# Patient Record
Sex: Female | Born: 1960
Health system: Southern US, Community
[De-identification: ages and names within clinical notes are randomized; demographics above are authoritative.]

## PROBLEM LIST (undated history)

## (undated) DIAGNOSIS — I10 Essential (primary) hypertension: Secondary | ICD-10-CM

## (undated) DIAGNOSIS — E876 Hypokalemia: Secondary | ICD-10-CM

## (undated) HISTORY — PX: EYE SURGERY: SHX253

## (undated) HISTORY — PX: TUBAL LIGATION: SHX77

## (undated) HISTORY — PX: FOOT SURGERY: SHX648

## (undated) HISTORY — PX: LEG SURGERY: SHX1003

## (undated) HISTORY — PX: HERNIA REPAIR: SHX51

---

## 2011-01-30 ENCOUNTER — Encounter: Payer: Self-pay | Admitting: *Deleted

## 2011-01-30 ENCOUNTER — Emergency Department (HOSPITAL_BASED_OUTPATIENT_CLINIC_OR_DEPARTMENT_OTHER)
Admission: EM | Admit: 2011-01-30 | Discharge: 2011-01-30 | Disposition: A | Discharge status: Home / Self Care | Payer: BC Managed Care – PPO | Attending: Emergency Medicine | Admitting: Emergency Medicine

## 2011-01-30 ENCOUNTER — Emergency Department (INDEPENDENT_AMBULATORY_CARE_PROVIDER_SITE_OTHER): Payer: BC Managed Care – PPO

## 2011-01-30 DIAGNOSIS — R109 Unspecified abdominal pain: Secondary | ICD-10-CM

## 2011-01-30 DIAGNOSIS — K449 Diaphragmatic hernia without obstruction or gangrene: Secondary | ICD-10-CM

## 2011-01-30 DIAGNOSIS — R110 Nausea: Secondary | ICD-10-CM

## 2011-01-30 DIAGNOSIS — I10 Essential (primary) hypertension: Secondary | ICD-10-CM

## 2011-01-30 DIAGNOSIS — K5900 Constipation, unspecified: Secondary | ICD-10-CM | POA: Insufficient documentation

## 2011-01-30 HISTORY — DX: Essential (primary) hypertension: I10

## 2011-01-30 LAB — COMPREHENSIVE METABOLIC PANEL WITH GFR
ALT: 8 U/L (ref 0–35)
AST: 15 U/L (ref 0–37)
Albumin: 3.8 g/dL (ref 3.5–5.2)
Alkaline Phosphatase: 76 U/L (ref 39–117)
BUN: 9 mg/dL (ref 6–23)
CO2: 27 meq/L (ref 19–32)
Calcium: 8.9 mg/dL (ref 8.4–10.5)
Chloride: 103 meq/L (ref 96–112)
Creatinine, Ser: 0.8 mg/dL (ref 0.50–1.10)
GFR calc Af Amer: >90 mL/min
GFR calc non Af Amer: 83 mL/min — ABNORMAL LOW
Glucose, Bld: 89 mg/dL (ref 70–99)
Potassium: 3.6 meq/L (ref 3.5–5.1)
Sodium: 138 meq/L (ref 135–145)
Total Bilirubin: 0.3 mg/dL (ref 0.3–1.2)
Total Protein: 7.2 g/dL (ref 6.0–8.3)

## 2011-01-30 LAB — URINALYSIS, ROUTINE W REFLEX MICROSCOPIC
Glucose, UA: NEGATIVE mg/dL
Hgb urine dipstick: NEGATIVE
Leukocytes, UA: NEGATIVE
Protein, ur: NEGATIVE mg/dL
Specific Gravity, Urine: 1.011 (ref 1.005–1.030)
pH: 5.5 (ref 5.0–8.0)

## 2011-01-30 LAB — DIFFERENTIAL
Basophils Absolute: 0 10*3/uL (ref 0.0–0.1)
Lymphocytes Relative: 11 % — ABNORMAL LOW (ref 12–46)
Lymphs Abs: 0.5 10*3/uL — ABNORMAL LOW (ref 0.7–4.0)
Monocytes Absolute: 0.2 10*3/uL (ref 0.1–1.0)
Neutro Abs: 3.8 10*3/uL (ref 1.7–7.7)

## 2011-01-30 LAB — CBC
HCT: 37.1 % (ref 36.0–46.0)
Hemoglobin: 12 g/dL (ref 12.0–15.0)
RBC: 4.69 MIL/uL (ref 3.87–5.11)
RDW: 14 % (ref 11.5–15.5)
WBC: 4.4 10*3/uL (ref 4.0–10.5)

## 2011-01-30 MED ORDER — SODIUM CHLORIDE 0.9 % IV SOLN
Freq: Once | INTRAVENOUS | Status: AC
  Administered 2011-01-30: 12:00:00 via INTRAVENOUS

## 2011-01-30 MED ORDER — MORPHINE SULFATE 4 MG/ML IJ SOLN
4.0000 mg | Freq: Once | INTRAMUSCULAR | Status: AC
  Administered 2011-01-30: 4 mg via INTRAVENOUS
  Filled 2011-01-30: qty 1

## 2011-01-30 MED ORDER — IOHEXOL 300 MG/ML  SOLN: 100.0000 mL | Freq: Once | INTRAMUSCULAR | Status: DC | PRN

## 2011-01-30 NOTE — ED Provider Notes (Signed)
History     CSN: [id number] Arrival date & time: 01/30/2011 10:33 AM   First MD Initiated Contact with Patient 01/30/11 1119      Chief Complaint  Patient presents with  . Abdominal Pain    (Consider location/radiation/quality/duration/timing/severity/associated sxs/prior treatment) Patient is a 50 y.o. female presenting with abdominal pain. The history is provided by the patient.  Abdominal Pain The primary symptoms of the illness include abdominal pain, fever, fatigue and nausea. The primary symptoms of the illness do not include vomiting or diarrhea. The current episode started yesterday. The onset of the illness was gradual. The problem has been gradually worsening.  The patient states that she believes she is currently not pregnant. The patient has not had a change in bowel habit. Symptoms associated with the illness do not include chills, constipation, urgency, hematuria or frequency.    Past Medical History  Diagnosis Date  . Hypertension     Past Surgical History  Procedure Date  . Cesarean section   . Foot surgery   . Leg surgery   . Eye surgery   . Tubal ligation     No family history on file.  History  Substance Use Topics  . Smoking status: Never Smoker   . Smokeless tobacco: Not on file  . Alcohol Use: No    OB History    Grav Para Term Preterm Abortions TAB SAB Ect Mult Living                  Review of Systems  Constitutional: Positive for fever and fatigue. Negative for chills.  Gastrointestinal: Positive for nausea and abdominal pain. Negative for vomiting, diarrhea and constipation.  Genitourinary: Negative for urgency, frequency and hematuria.  All other systems reviewed and are negative.    Allergies  Review of patient's allergies indicates no known allergies.  Home Medications   Current Outpatient Rx  Name Route Sig Dispense Refill  . AMLODIPINE BESYLATE 10 MG PO TABS Oral Take 10 mg by mouth daily.      . ASPIRIN 325 MG PO TABS  Oral Take 325 mg by mouth daily.      Marland Kitchen POTASSIUM CHLORIDE CRYS CR 20 MEQ PO TBCR Oral Take 20 mEq by mouth 2 (two) times daily.      . TERBINAFINE HCL 250 MG PO TABS Oral Take 250 mg by mouth daily.        BP 153/75  Pulse 75  Temp(Src) 99.9 F (37.7 C) (Oral)  Resp 18  SpO2 100%  Physical Exam  Nursing note and vitals reviewed. Constitutional: She is oriented to person, place, and time. She appears well-developed and well-nourished. No distress.  HENT:  Head: Normocephalic and atraumatic.  Neck: Normal range of motion. Neck supple.  Cardiovascular: Normal rate and regular rhythm.  Exam reveals no gallop and no friction rub.   No murmur heard. Pulmonary/Chest: Effort normal and breath sounds normal. No respiratory distress. She has no wheezes.  Abdominal: Soft. Bowel sounds are normal. She exhibits no distension (mild ttp in the periumbilical area.  No rebound or guarding). There is tenderness.  Musculoskeletal: Normal range of motion.  Neurological: She is alert and oriented to person, place, and time.  Skin: Skin is warm and dry. She is not diaphoretic.    ED Course  Procedures (including critical care time)   Labs Reviewed  URINALYSIS, ROUTINE W REFLEX MICROSCOPIC  CBC  DIFFERENTIAL  COMPREHENSIVE METABOLIC PANEL  LIPASE, BLOOD   No results found.  No diagnosis found.    MDM  Labs, CT okay with the exception of suspected constipation.          Geoffery Lyons, MD 01/30/11 1346

## 2011-01-30 NOTE — ED Notes (Signed)
Patient c/o abd pain for the past two days, nausea, no v/d, took two tylenol last night for HA, able to sip Coke, states she doesn't feel like eating anything due to abd pain

## 2011-02-22 ENCOUNTER — Emergency Department (INDEPENDENT_AMBULATORY_CARE_PROVIDER_SITE_OTHER): Payer: BC Managed Care – PPO

## 2011-02-22 ENCOUNTER — Encounter (HOSPITAL_BASED_OUTPATIENT_CLINIC_OR_DEPARTMENT_OTHER): Payer: Self-pay | Admitting: *Deleted

## 2011-02-22 ENCOUNTER — Emergency Department (HOSPITAL_BASED_OUTPATIENT_CLINIC_OR_DEPARTMENT_OTHER)
Admission: EM | Admit: 2011-02-22 | Discharge: 2011-02-22 | Disposition: A | Discharge status: Home / Self Care | Payer: BC Managed Care – PPO | Attending: Emergency Medicine | Admitting: Emergency Medicine

## 2011-02-22 DIAGNOSIS — I10 Essential (primary) hypertension: Secondary | ICD-10-CM | POA: Insufficient documentation

## 2011-02-22 DIAGNOSIS — R05 Cough: Secondary | ICD-10-CM | POA: Insufficient documentation

## 2011-02-22 DIAGNOSIS — R509 Fever, unspecified: Secondary | ICD-10-CM

## 2011-02-22 DIAGNOSIS — J111 Influenza due to unidentified influenza virus with other respiratory manifestations: Secondary | ICD-10-CM | POA: Insufficient documentation

## 2011-02-22 DIAGNOSIS — R059 Cough, unspecified: Secondary | ICD-10-CM | POA: Insufficient documentation

## 2011-02-22 DIAGNOSIS — R0989 Other specified symptoms and signs involving the circulatory and respiratory systems: Secondary | ICD-10-CM

## 2011-02-22 DIAGNOSIS — R52 Pain, unspecified: Secondary | ICD-10-CM

## 2011-02-22 MED ORDER — ACETAMINOPHEN 325 MG PO TABS
650.0000 mg | ORAL_TABLET | Freq: Once | ORAL | Status: AC
  Administered 2011-02-22: 650 mg via ORAL
  Filled 2011-02-22: qty 2

## 2011-02-22 MED ORDER — OSELTAMIVIR PHOSPHATE 75 MG PO CAPS: 75.0000 mg | ORAL_CAPSULE | Freq: Two times a day (BID) | ORAL | Status: AC

## 2011-02-22 NOTE — ED Provider Notes (Signed)
History     CSN: [id number] Arrival date & time: 02/22/2011  7:36 PM   First MD Initiated Contact with Patient 02/22/11 1959      Chief Complaint  Patient presents with  . Cough  . Chills    (Consider location/radiation/quality/duration/timing/severity/associated sxs/prior treatment) Patient is a 50 y.o. female presenting with cough. The history is provided by the patient.  Cough This is a new problem. The current episode started yesterday. The problem occurs constantly. The problem has been rapidly worsening. The cough is non-productive. The maximum temperature recorded prior to her arrival was 101 to 101.9 F. The fever has been present for less than 1 day. Associated symptoms include chills, sweats, headaches and myalgias. Pertinent negatives include no chest pain, no ear congestion and no shortness of breath. She has tried nothing for the symptoms. She is not a smoker.    Past Medical History  Diagnosis Date  . Hypertension     Past Surgical History  Procedure Date  . Cesarean section   . Foot surgery   . Leg surgery   . Eye surgery   . Tubal ligation     No family history on file.  History  Substance Use Topics  . Smoking status: Never Smoker   . Smokeless tobacco: Not on file  . Alcohol Use: No    OB History    Grav Para Term Preterm Abortions TAB SAB Ect Mult Living                  Review of Systems  Constitutional: Positive for chills.  Respiratory: Positive for cough. Negative for shortness of breath.   Cardiovascular: Negative for chest pain.  Musculoskeletal: Positive for myalgias.  Neurological: Positive for headaches.  All other systems reviewed and are negative.    Allergies  Review of patient's allergies indicates no known allergies.  Home Medications   Current Outpatient Rx  Name Route Sig Dispense Refill  . ACETAMINOPHEN 500 MG PO TABS Oral Take 500 mg by mouth every 6 (six) hours as needed. For fever and pain     . AMLODIPINE  BESYLATE 10 MG PO TABS Oral Take 10 mg by mouth daily.      . ASPIRIN 325 MG PO TABS Oral Take 325 mg by mouth daily.      Marland Kitchen POTASSIUM CHLORIDE CRYS CR 20 MEQ PO TBCR Oral Take 20 mEq by mouth 2 (two) times daily.        BP 154/82  Pulse 115  Temp(Src) 101 F (38.3 C) (Oral)  Resp 22  Ht 5\' 3"  (1.6 m)  Wt 165 lb (74.844 kg)  BMI 29.23 kg/m2  SpO2 100%  Physical Exam  Nursing note and vitals reviewed. Constitutional: She is oriented to person, place, and time. She appears well-developed and well-nourished. No distress.  HENT:  Head: Normocephalic and atraumatic.  Neck: Normal range of motion. Neck supple.  Cardiovascular: Normal rate and regular rhythm.  Exam reveals no gallop and no friction rub.   No murmur heard. Pulmonary/Chest: Effort normal and breath sounds normal. No respiratory distress. She has no wheezes.  Abdominal: Soft. Bowel sounds are normal. She exhibits no distension. There is no tenderness.  Musculoskeletal: Normal range of motion.  Neurological: She is alert and oriented to person, place, and time.  Skin: Skin is warm and dry. She is not diaphoretic.    ED Course  Procedures (including critical care time)  Labs Reviewed - No data to display No results found.  No diagnosis found.    MDM  Symptoms sound like influenza.  Patient would like tamiflu.  Will prescribe this.  To return prn.        Geoffery Lyons, MD 02/22/11 2005

## 2011-02-22 NOTE — ED Notes (Signed)
C/o fever, chills, and cough x 2 days

## 2015-11-12 ENCOUNTER — Emergency Department (HOSPITAL_BASED_OUTPATIENT_CLINIC_OR_DEPARTMENT_OTHER)
Admission: EM | Admit: 2015-11-12 | Discharge: 2015-11-12 | Disposition: A | Discharge status: Home / Self Care | Payer: Commercial Managed Care - HMO | Attending: Emergency Medicine | Admitting: Emergency Medicine

## 2015-11-12 ENCOUNTER — Encounter (HOSPITAL_BASED_OUTPATIENT_CLINIC_OR_DEPARTMENT_OTHER): Payer: Self-pay | Admitting: *Deleted

## 2015-11-12 DIAGNOSIS — Z7982 Long term (current) use of aspirin: Secondary | ICD-10-CM | POA: Insufficient documentation

## 2015-11-12 DIAGNOSIS — Z79899 Other long term (current) drug therapy: Secondary | ICD-10-CM | POA: Diagnosis not present

## 2015-11-12 DIAGNOSIS — K529 Noninfective gastroenteritis and colitis, unspecified: Secondary | ICD-10-CM | POA: Insufficient documentation

## 2015-11-12 DIAGNOSIS — R112 Nausea with vomiting, unspecified: Secondary | ICD-10-CM | POA: Diagnosis present

## 2015-11-12 DIAGNOSIS — I10 Essential (primary) hypertension: Secondary | ICD-10-CM | POA: Insufficient documentation

## 2015-11-12 LAB — CBC WITH DIFFERENTIAL/PLATELET
Basophils Absolute: 0 10*3/uL (ref 0.0–0.1)
Basophils Relative: 0 %
EOS PCT: 1 %
Eosinophils Absolute: 0 10*3/uL (ref 0.0–0.7)
HCT: 37.9 % (ref 36.0–46.0)
Hemoglobin: 12.2 g/dL (ref 12.0–15.0)
LYMPHS ABS: 1.1 10*3/uL (ref 0.7–4.0)
LYMPHS PCT: 20 %
MCH: 26.3 pg (ref 26.0–34.0)
MCHC: 32.2 g/dL (ref 30.0–36.0)
MCV: 81.7 fL (ref 78.0–100.0)
MONO ABS: 0.3 10*3/uL (ref 0.1–1.0)
Monocytes Relative: 6 %
Neutro Abs: 4 10*3/uL (ref 1.7–7.7)
Neutrophils Relative %: 73 %
PLATELETS: 193 10*3/uL (ref 150–400)
RBC: 4.64 MIL/uL (ref 3.87–5.11)
RDW: 13.8 % (ref 11.5–15.5)
WBC: 5.5 10*3/uL (ref 4.0–10.5)

## 2015-11-12 LAB — COMPREHENSIVE METABOLIC PANEL
ALK PHOS: 53 U/L (ref 38–126)
ALT: 15 U/L (ref 14–54)
AST: 18 U/L (ref 15–41)
Albumin: 4.1 g/dL (ref 3.5–5.0)
Anion gap: 7 (ref 5–15)
BUN: 13 mg/dL (ref 6–20)
CALCIUM: 8.8 mg/dL — AB (ref 8.9–10.3)
CHLORIDE: 102 mmol/L (ref 101–111)
CO2: 26 mmol/L (ref 22–32)
CREATININE: 0.76 mg/dL (ref 0.44–1.00)
GFR calc Af Amer: >60 mL/min (ref >60)
Glucose, Bld: 99 mg/dL (ref 65–99)
Potassium: 3.6 mmol/L (ref 3.5–5.1)
SODIUM: 135 mmol/L (ref 135–145)
Total Bilirubin: 0.4 mg/dL (ref 0.3–1.2)
Total Protein: 7.1 g/dL (ref 6.5–8.1)

## 2015-11-12 LAB — LIPASE, BLOOD: Lipase: 37 U/L (ref 11–51)

## 2015-11-12 MED ORDER — ONDANSETRON 8 MG PO TBDP: ORAL_TABLET | ORAL | 0 refills | Status: DC

## 2015-11-12 MED ORDER — SODIUM CHLORIDE 0.9 % IV BOLUS (SEPSIS)
1000.0000 mL | Freq: Once | INTRAVENOUS | Status: AC
  Administered 2015-11-12: 1000 mL via INTRAVENOUS

## 2015-11-12 MED ORDER — KETOROLAC TROMETHAMINE 30 MG/ML IJ SOLN
30.0000 mg | Freq: Once | INTRAMUSCULAR | Status: AC
  Administered 2015-11-12: 30 mg via INTRAVENOUS
  Filled 2015-11-12: qty 1

## 2015-11-12 MED ORDER — ONDANSETRON HCL 4 MG/2ML IJ SOLN
4.0000 mg | Freq: Once | INTRAMUSCULAR | Status: AC
  Administered 2015-11-12: 4 mg via INTRAVENOUS
  Filled 2015-11-12: qty 2

## 2015-11-12 MED FILL — ONDANSETRON ODT 8 MG TABLET: 8 | 1 days supply | Qty: 6 | Fill #0

## 2015-11-12 NOTE — ED Provider Notes (Signed)
[facility]   CSN: [id number] Arrival date & time: 11/12/15  1401     History   Chief Complaint Chief Complaint  Patient presents with  . Emesis    HPI Blandina Renaldo is a 55 y.o. female.  Patient is a 55 year old female with history of hypertension. She presents for evaluation of nausea and vomiting. She reports eating a salad at lunch, then shortly after she became very nauseated and vomited multiple times. She reports coughing up a small amount of blood afterward. She denies any abdominal pain. She denies any diarrhea. She denies any ill contacts.   The history is provided by the patient.  Emesis   This is a new problem. The current episode started 1 to 2 hours ago. The problem has been resolved. There has been no fever. Pertinent negatives include no abdominal pain, no chills, no diarrhea and no fever.    Past Medical History:  Diagnosis Date  . Hypertension     There are no active problems to display for this patient.   Past Surgical History:  Procedure Laterality Date  . CESAREAN SECTION    . EYE SURGERY    . FOOT SURGERY    . HERNIA REPAIR    . LEG SURGERY    . TUBAL LIGATION      OB History    No data available       Home Medications    Prior to Admission medications   Medication Sig Start Date End Date Taking? Authorizing Provider  amLODipine (NORVASC) 10 MG tablet Take 10 mg by mouth daily.     Yes Historical Provider, MD  aspirin 325 MG tablet Take 325 mg by mouth daily.     Yes Historical Provider, MD  losartan (COZAAR) 25 MG tablet Take 25 mg by mouth daily.   Yes Historical Provider, MD  acetaminophen (TYLENOL) 500 MG tablet Take 500 mg by mouth every 6 (six) hours as needed. For fever and pain     Historical Provider, MD  potassium chloride SA (K-DUR,KLOR-CON) 20 MEQ tablet Take 20 mEq by mouth 2 (two) times daily.      Historical Provider, MD    Family History No family history on file.  Social History Social  History  Substance Use Topics  . Smoking status: Never Smoker  . Smokeless tobacco: Never Used  . Alcohol use No     Allergies   Review of patient's allergies indicates no known allergies.   Review of Systems Review of Systems  Constitutional: Negative for chills and fever.  Gastrointestinal: Positive for vomiting. Negative for abdominal pain and diarrhea.  All other systems reviewed and are negative.    Physical Exam Updated Vital Signs BP 149/78   Pulse 69   Temp 98.2 F (36.8 C) (Oral)   Resp 20   Ht 5\' 2"  (1.575 m)   Wt 147 lb (66.7 kg)   SpO2 100%   BMI 26.89 kg/m   Physical Exam  Constitutional: She is oriented to person, place, and time. She appears well-developed and well-nourished. No distress.  HENT:  Head: Normocephalic and atraumatic.  Neck: Normal range of motion. Neck supple.  Cardiovascular: Normal rate and regular rhythm.  Exam reveals no gallop and no friction rub.   No murmur heard. Pulmonary/Chest: Effort normal and breath sounds normal. No respiratory distress. She has no wheezes.  Abdominal: Soft. Bowel sounds are normal. She exhibits no distension. There is no tenderness.  Musculoskeletal: Normal range of motion.  Neurological: She is alert and oriented to person, place, and time.  Skin: Skin is warm and dry. She is not diaphoretic.  Nursing note and vitals reviewed.    ED Treatments / Results  Labs (all labs ordered are listed, but only abnormal results are displayed) Labs Reviewed  COMPREHENSIVE METABOLIC PANEL  LIPASE, BLOOD  CBC WITH DIFFERENTIAL/PLATELET    EKG  EKG Interpretation None       Radiology No results found.  Procedures Procedures (including critical care time)  Medications Ordered in ED Medications  sodium chloride 0.9 % bolus 1,000 mL (not administered)  ketorolac (TORADOL) 30 MG/ML injection 30 mg (not administered)  ondansetron (ZOFRAN) injection 4 mg (not administered)     Initial Impression /  Assessment and Plan / ED Course  I have reviewed the triage vital signs and the nursing notes.  Pertinent labs & imaging results that were available during my care of the patient were reviewed by me and considered in my medical decision making (see chart for details).  Clinical Course    Patient presents with complaints of multiple episodes of vomiting that occurred shortly after eating a salad at work. Her abdominal exam is benign and she is complaining of no pain. She is afebrile and vitals are stable. Laboratory studies are reassuring. She is feeling better after IV fluids, Toradol, and Zofran and I believe appropriate for discharge. I suspect some sort of food borne illness or possibly a viral etiology. She will be discharged with Zofran and when necessary return.  Final Clinical Impressions(s) / ED Diagnoses   Final diagnoses:  None    New Prescriptions New Prescriptions   No medications on file     Geoffery Lyonsouglas Nason Conradt, MD 11/12/15 1530

## 2015-11-12 NOTE — ED Triage Notes (Signed)
States after eating a salad she vomited. She left work due to nausea. She vomited 5 more times with force and saw some blood in her vomitus.

## 2015-11-12 NOTE — Discharge Instructions (Signed)
Zofran as prescribed as needed for nausea.  Return to the emergency department if you develop severe abdominal pain, worsening vomiting, or other new and concerning symptoms.

## 2017-01-31 ENCOUNTER — Encounter: Payer: Self-pay | Admitting: Emergency Medicine

## 2017-01-31 ENCOUNTER — Emergency Department (HOSPITAL_BASED_OUTPATIENT_CLINIC_OR_DEPARTMENT_OTHER): Payer: 59

## 2017-01-31 ENCOUNTER — Emergency Department (HOSPITAL_BASED_OUTPATIENT_CLINIC_OR_DEPARTMENT_OTHER)
Admission: EM | Admit: 2017-01-31 | Discharge: 2017-01-31 | Disposition: A | Discharge status: Home / Self Care | Payer: 59 | Attending: Physician Assistant | Admitting: Physician Assistant

## 2017-01-31 DIAGNOSIS — Z79899 Other long term (current) drug therapy: Secondary | ICD-10-CM | POA: Insufficient documentation

## 2017-01-31 DIAGNOSIS — I10 Essential (primary) hypertension: Secondary | ICD-10-CM | POA: Diagnosis not present

## 2017-01-31 DIAGNOSIS — M25561 Pain in right knee: Secondary | ICD-10-CM | POA: Diagnosis present

## 2017-01-31 MED ORDER — IBUPROFEN 800 MG PO TABS: 800.0000 mg | ORAL_TABLET | Freq: Three times a day (TID) | ORAL | 0 refills | Status: DC

## 2017-01-31 MED FILL — IBUPROFEN 800 MG TAB: 800 | 7 days supply | Qty: 21 | Fill #0

## 2017-01-31 NOTE — ED Provider Notes (Signed)
[facility] EMERGENCY DEPARTMENT Provider Note   CSN: [id number] Arrival date & time: 01/31/17  [id number]     History   Chief Complaint Chief Complaint  Patient presents with  . Knee Pain    right    HPI Rebecca Thompson is a 56 y.o. female.  HPI   Patient is a 56 year old female presenting with right knee pain.  Patient has noticed over the last week she has been having increasing knee pain.  She put up a pack of ice on it yesterday and she let it get too cold.  She felt like she had a mild abrasion to the top of the skin because of cold ice.  Patient reports now increased swelling to the anterior part of the knee.  Patient has mild pain with flexion but mostly pain with use.  No fevers or other signs of systemic illness.  No trauma.  Past Medical History:  Diagnosis Date  . Hypertension     There are no active problems to display for this patient.   Past Surgical History:  Procedure Laterality Date  . CESAREAN SECTION    . EYE SURGERY    . FOOT SURGERY    . HERNIA REPAIR    . LEG SURGERY    . TUBAL LIGATION      OB History    No data available       Home Medications    Prior to Admission medications   Medication Sig Start Date End Date Taking? Authorizing Provider  amLODipine (NORVASC) 10 MG tablet Take 10 mg by mouth daily.     Yes [provider]  aspirin 325 MG tablet Take 325 mg by mouth daily.     Yes [provider]  losartan (COZAAR) 25 MG tablet Take 25 mg by mouth daily.   Yes [provider]  potassium chloride SA (K-DUR,KLOR-CON) 20 MEQ tablet Take 20 mEq by mouth 2 (two) times daily.     Yes [provider]  acetaminophen (TYLENOL) 500 MG tablet Take 500 mg by mouth every 6 (six) hours as needed. For fever and pain     [provider]  ibuprofen (ADVIL,MOTRIN) 800 MG tablet Take 1 tablet (800 mg total) by mouth 3 (three) times daily. 01/31/17   Kenidee Cregan, Cindee Saltourteney Lyn, MD  ondansetron (ZOFRAN ODT) 8  MG disintegrating tablet 8mg  ODT q4 hours prn nausea 11/12/15   Geoffery Lyonselo, Douglas, MD    Family History No family history on file.  Social History Social History   Tobacco Use  . Smoking status: Never Smoker  . Smokeless tobacco: Never Used  Substance Use Topics  . Alcohol use: No  . Drug use: No     Allergies   Patient has no known allergies.   Review of Systems Review of Systems  Constitutional: Negative for activity change.  Respiratory: Negative for shortness of breath.   Cardiovascular: Negative for chest pain.  Gastrointestinal: Negative for abdominal pain.  Musculoskeletal: Positive for arthralgias.     Physical Exam Updated Vital Signs BP (!) 142/91 (BP Location: Left Arm)   Pulse 85   Temp 98.3 F (36.8 C) (Oral)   Resp 20   Ht 5\' 3"  (1.6 m)   Wt 68 kg (150 lb)   SpO2 100%   BMI 26.57 kg/m   Physical Exam  Constitutional: She is oriented to person, place, and time. She appears well-developed and well-nourished.  HENT:  Head: Normocephalic and atraumatic.  Eyes: Right eye exhibits no  discharge.  Cardiovascular: Normal rate, regular rhythm and normal heart sounds.  No murmur heard. Pulmonary/Chest: Effort normal.  Musculoskeletal:  R knee with swelling, no erythema, no pain with passive or active motion.   Neurological: She is oriented to person, place, and time.  Skin: Skin is warm and dry. She is not diaphoretic.  Psychiatric: She has a normal mood and affect.  Nursing note and vitals reviewed.    ED Treatments / Results  Labs (all labs ordered are listed, but only abnormal results are displayed) Labs Reviewed - No data to display  EKG  EKG Interpretation None       Radiology Dg Knee Complete 4 Views Right  Result Date: 01/31/2017 CLINICAL DATA:  Right knee pain for 6 days EXAM: RIGHT KNEE - COMPLETE 4+ VIEW COMPARISON:  None. FINDINGS: No evidence of fracture, dislocation, or joint effusion. No evidence of arthropathy or other focal  bone abnormality. Soft tissues are unremarkable. IMPRESSION: Negative. Electronically Signed   By: Charlett NoseKevin  Dover M.D.   On: 01/31/2017 09:13    Procedures Procedures (including critical care time)  Medications Ordered in ED Medications - No data to display   Initial Impression / Assessment and Plan / ED Course  I have reviewed the triage vital signs and the nursing notes.  Pertinent labs & imaging results that were available during my care of the patient were reviewed by me and considered in my medical decision making (see chart for details).    Patient is a 56 year old female presenting with right knee pain.  Patient has noticed over the last week she has been having increasing knee pain.  She put up a pack of ice on it yesterday and she let it get too cold.  She felt like she had a mild abrasion to the top of the skin because of cold ice.  Patient reports now increased swelling to the anterior part of the knee.  Patient has mild pain with flexion but mostly pain with use.  No fevers or other signs of systemic illness.  No trauma.  9:02 AM  Suspicion for septic arthritis.  I do suspect this could be a bursitis.   Xray normal. Will have her do symptomatic first line therapy of ibiuprofen and rest ice elevate, and follow up with [name] as outpatient if not improving.    Final Clinical Impressions(s) / ED Diagnoses   Final diagnoses:  Acute pain of right knee    ED Discharge Orders        Ordered    ibuprofen (ADVIL,MOTRIN) 800 MG tablet  3 times daily     01/31/17 0959       Abelino DerrickMackuen, Deisi Salonga Lyn, MD 01/31/17 1000

## 2017-01-31 NOTE — ED Notes (Signed)
Pt back from XR in WC.

## 2017-01-31 NOTE — ED Triage Notes (Addendum)
Pt reports right knee pain since last Wednesday; reports hx of arthritis in knee. Pt denies injury. Pt has been putting an ice pack on right knee without something over it; knee is pink and painful to the touch. Pain rated 9/10.

## 2017-01-31 NOTE — Discharge Instructions (Signed)
We think likely a bursitis.  We want you to use ibuprofen around-the-clock to help with inflammation.  In addition use a knee sleeve to help support.  You can use ice elevation to help with symptoms.  You should follow-up with Dr. Pearletha ForgeHudnall listed above if you are not improving.  Be sure to take the ibuprofen with food as we discussed.

## 2018-09-18 ENCOUNTER — Emergency Department (HOSPITAL_BASED_OUTPATIENT_CLINIC_OR_DEPARTMENT_OTHER)
Admission: EM | Admit: 2018-09-18 | Discharge: 2018-09-18 | Disposition: A | Discharge status: Home / Self Care | Payer: 59 | Attending: Emergency Medicine | Admitting: Emergency Medicine

## 2018-09-18 ENCOUNTER — Other Ambulatory Visit: Payer: Self-pay

## 2018-09-18 ENCOUNTER — Encounter (HOSPITAL_BASED_OUTPATIENT_CLINIC_OR_DEPARTMENT_OTHER): Payer: Self-pay | Admitting: Emergency Medicine

## 2018-09-18 DIAGNOSIS — M5432 Sciatica, left side: Secondary | ICD-10-CM | POA: Insufficient documentation

## 2018-09-18 DIAGNOSIS — I10 Essential (primary) hypertension: Secondary | ICD-10-CM | POA: Insufficient documentation

## 2018-09-18 HISTORY — DX: Hypokalemia: E87.6

## 2018-09-18 MED ORDER — PREDNISONE 20 MG PO TABS: 40.0000 mg | ORAL_TABLET | Freq: Every day | ORAL | 0 refills | Status: AC

## 2018-09-18 MED FILL — predniSONE 20 MG TABS: 20 | 5 days supply | Qty: 10 | Fill #0

## 2018-09-18 NOTE — ED Provider Notes (Signed)
[facility] Emergency Department Provider Note MRN:  [id number]  Arrival date & time: 09/18/18     Chief Complaint   Leg Pain   History of Present Illness   Rebecca Thompson is a 58 y.o. year-old female with a history of hypertension presenting to the ED with chief complaint of leg pain.  Patient began having back pain 2 weeks ago, which radiated down the back of the left leg.  The back pain resolved, continues to have some pain to the left buttocks rating down to the left leg.  5 days ago noticed that the lateral side of her left foot has decreased sensation.  She denies fever, no IV drug use, no chest pain or shortness of breath, no abdominal pain, no bowel or bladder dysfunction, no numbness to the lab area, no weakness to the legs, no numbness or weakness to the arms.  Review of Systems  A complete 10 system review of systems was obtained and all systems are negative except as noted in the HPI and PMH.   Patient's Health History    Past Medical History:  Diagnosis Date  . Hypertension   . Hypokalemia     Past Surgical History:  Procedure Laterality Date  . CESAREAN SECTION    . EYE SURGERY    . FOOT SURGERY    . HERNIA REPAIR    . LEG SURGERY    . TUBAL LIGATION      No family history on file.  Social History   Socioeconomic History  . Marital status: Single    Spouse name: Not on file  . Number of children: Not on file  . Years of education: Not on file  . Highest education level: Not on file  Occupational History  . Not on file  Social Needs  . Financial resource strain: Not on file  . Food insecurity    Worry: Not on file    Inability: Not on file  . Transportation needs    Medical: Not on file    Non-medical: Not on file  Tobacco Use  . Smoking status: Never Smoker  . Smokeless tobacco: Never Used  Substance and Sexual Activity  . Alcohol use: No  . Drug use: No  . Sexual activity: Yes    Birth control/protection: Surgical   Lifestyle  . Physical activity    Days per week: Not on file    Minutes per session: Not on file  . Stress: Not on file  Relationships  . Social Musicianconnections    Talks on phone: Not on file    Gets together: Not on file    Attends religious service: Not on file    Active member of club or organization: Not on file    Attends meetings of clubs or organizations: Not on file    Relationship status: Not on file  . Intimate partner violence    Fear of current or ex partner: Not on file    Emotionally abused: Not on file    Physically abused: Not on file    Forced sexual activity: Not on file  Other Topics Concern  . Not on file  Social History Narrative  . Not on file     Physical Exam  Vital Signs and Nursing Notes reviewed Vitals:   09/18/18 0756  BP: (!) 155/73  Pulse: 70  Resp: 16  Temp: 98.3 F (36.8 C)  SpO2: 100%    CONSTITUTIONAL: Well-appearing, NAD NEURO:  Alert and oriented  x 3, no focal deficits EYES:  eyes equal and reactive ENT/NECK:  no LAD, no JVD CARDIO: Regular rate, well-perfused, normal S1 and S2 PULM:  CTAB no wheezing or rhonchi GI/GU:  normal bowel sounds, non-distended, non-tender MSK/SPINE:  No gross deformities, no edema; positive left straight leg test, no lumbar spinal tenderness, 2+ patellar reflexes bilaterally, subjective decreased sensation to the lateral aspect of the left foot in the S1 distribution SKIN:  no rash, atraumatic PSYCH:  Appropriate speech and behavior  Diagnostic and Interventional Summary    Labs Reviewed - No data to display  No orders to display    Medications - No data to display   Procedures Critical Care  ED Course and Medical Decision Making  I have reviewed the triage vital signs and the nursing notes.  Pertinent labs & imaging results that were available during my care of the patient were reviewed by me and considered in my medical decision making (see below for details).  Favoring sciatica with distal  decreased sensation in the foot.  Patient has no bowel or bladder dysfunction, no progressive neurological deficit as her symptoms have been static for the past 5 days.  No indication for emergent MRI or further testing today, no significant trauma.  Patient would likely benefit from a steroid burst and if not improving follow-up with a neurosurgeon for possible MRI imaging as an outpatient.  After the discussed management above, the patient was determined to be safe for discharge.  The patient was in agreement with this plan and all questions regarding their care were answered.  ED return precautions were discussed and the patient will return to the ED with any significant worsening of condition.  Barth Kirks. Sedonia Small, MD [place] [email or web address]  Final Clinical Impressions(s) / ED Diagnoses     ICD-10-CM   1. Sciatica of left side  M54.32     ED Discharge Orders         Ordered    predniSONE (DELTASONE) 20 MG tablet  Daily     09/18/18 0824             Maudie Flakes, MD 09/18/18 [phone]

## 2018-09-18 NOTE — ED Notes (Signed)
ED Provider at bedside. 

## 2018-09-18 NOTE — Discharge Instructions (Addendum)
You were evaluated in the Emergency Department and after careful evaluation, we did not find any emergent condition requiring admission or further testing in the hospital.  Your symptoms today seem to be due to sciatica, caused by an inflammation of 1 of the nerves.  We recommend that you take the anti-inflammatory prednisone medication as directed.  If your symptoms are not improved within 1 to 2 weeks, we recommend following up with the specialist clinic provider.  Please return to the Emergency Department if you experience any worsening of your condition.  We encourage you to follow up with a primary care provider.  Thank you for allowing [place] to be a part of your care.

## 2018-09-18 NOTE — ED Triage Notes (Signed)
Pt woke up 2 weeks ago with left low back pain. Radiates down left leg.  No known injury.  No improvement.  Now lateral side of left foot is feeling numb. No problem with bowel or bladder function.

## 2018-09-22 ENCOUNTER — Other Ambulatory Visit: Payer: Self-pay

## 2018-09-22 ENCOUNTER — Emergency Department (HOSPITAL_BASED_OUTPATIENT_CLINIC_OR_DEPARTMENT_OTHER)
Admission: EM | Admit: 2018-09-22 | Discharge: 2018-09-22 | Disposition: A | Discharge status: Home / Self Care | Payer: Self-pay | Attending: Emergency Medicine | Admitting: Emergency Medicine

## 2018-09-22 ENCOUNTER — Encounter (HOSPITAL_BASED_OUTPATIENT_CLINIC_OR_DEPARTMENT_OTHER): Payer: Self-pay | Admitting: Adult Health

## 2018-09-22 ENCOUNTER — Emergency Department (HOSPITAL_BASED_OUTPATIENT_CLINIC_OR_DEPARTMENT_OTHER): Payer: Self-pay

## 2018-09-22 DIAGNOSIS — M5432 Sciatica, left side: Secondary | ICD-10-CM | POA: Insufficient documentation

## 2018-09-22 DIAGNOSIS — Z79899 Other long term (current) drug therapy: Secondary | ICD-10-CM | POA: Insufficient documentation

## 2018-09-22 DIAGNOSIS — I10 Essential (primary) hypertension: Secondary | ICD-10-CM | POA: Insufficient documentation

## 2018-09-22 DIAGNOSIS — Z7982 Long term (current) use of aspirin: Secondary | ICD-10-CM | POA: Insufficient documentation

## 2018-09-22 MED ORDER — KETOROLAC TROMETHAMINE 60 MG/2ML IM SOLN
30.0000 mg | Freq: Once | INTRAMUSCULAR | Status: AC
  Administered 2018-09-22: 20:00:00 30 mg via INTRAMUSCULAR
  Filled 2018-09-22: qty 2

## 2018-09-22 MED ORDER — CYCLOBENZAPRINE HCL 10 MG PO TABS: 10.0000 mg | ORAL_TABLET | Freq: Two times a day (BID) | ORAL | 0 refills | Status: DC | PRN

## 2018-09-22 MED ORDER — DICLOFENAC SODIUM 1 % TD GEL: 2.0000 g | Freq: Four times a day (QID) | TRANSDERMAL | 0 refills | Status: DC

## 2018-09-22 NOTE — Discharge Instructions (Addendum)
Follow-up with your doctor for further treatment.  You may need physical therapy or referral to a specialist if not improving. Apply warm compresses to the area for 30 minutes at a time.  Take Flexeril as needed as prescribed for pain and spasm, do not drive or operate machinery if taking Flexeril.  Apply Voltaren gel to area to help with pain.  You may also try over-the-counter lidocaine patches.

## 2018-09-22 NOTE — ED Notes (Signed)
Pt up to bathroom. Walking slowly and slightly limping to bathroom.

## 2018-09-22 NOTE — ED Provider Notes (Signed)
[facility] EMERGENCY DEPARTMENT Provider Note   CSN: [id number] Arrival date & time: 09/22/18  1924    History   Chief Complaint Chief Complaint  Patient presents with  . Back Pain    HPI Rebecca Thompson is a 58 y.o. female.     58 year old female presents with complaint of pain in her left buttock area that radiates down her posterior left leg to her calf and sometimes into her lateral left foot.  Patient states pain is worse with lying on her back as well as trying to ambulate.  Pain started 3 weeks ago without injury, states she woke up with the pain.  Patient was seen in the emergency room a few days ago, started on prednisone, states this is not helping.  Patient denies abdominal pain, fever, changes in bowel or bladder habits, groin numbness, IV drug use.  No other complaints or concerns.     Past Medical History:  Diagnosis Date  . Hypertension   . Hypokalemia     There are no active problems to display for this patient.   Past Surgical History:  Procedure Laterality Date  . CESAREAN SECTION    . EYE SURGERY    . FOOT SURGERY    . HERNIA REPAIR    . LEG SURGERY    . TUBAL LIGATION       OB History   No obstetric history on file.      Home Medications    Prior to Admission medications   Medication Sig Start Date End Date Taking? Authorizing Provider  amLODipine (NORVASC) 10 MG tablet Take 10 mg by mouth daily.     Yes [provider]  aspirin 325 MG tablet Take 325 mg by mouth daily.     Yes [provider]  potassium chloride SA (K-DUR,KLOR-CON) 20 MEQ tablet Take 20 mEq by mouth 2 (two) times daily.     Yes [provider]  cyclobenzaprine (FLEXERIL) 10 MG tablet Take 1 tablet (10 mg total) by mouth 2 (two) times daily as needed for muscle spasms. 09/22/18   Jeannie FendMurphy, Safwan Tomei A, PA-C  diclofenac sodium (VOLTAREN) 1 % GEL Apply 2 g topically 4 (four) times daily. 09/22/18   Jeannie FendMurphy, Garner Dullea A, PA-C  predniSONE (DELTASONE) 20  MG tablet Take 2 tablets (40 mg total) by mouth daily for 5 days. 09/18/18 09/23/18  Sabas SousBero, Michael M, MD    Family History History reviewed. No pertinent family history.  Social History Social History   Tobacco Use  . Smoking status: Never Smoker  . Smokeless tobacco: Never Used  Substance Use Topics  . Alcohol use: No  . Drug use: No     Allergies   Patient has no known allergies.   Review of Systems Review of Systems  Constitutional: Negative for fever.  Gastrointestinal: Negative for abdominal pain, constipation, diarrhea, nausea and vomiting.  Genitourinary: Negative for dysuria, frequency and urgency.  Musculoskeletal: Positive for back pain and gait problem. Negative for arthralgias, joint swelling, myalgias, neck pain and neck stiffness.  Skin: Negative for rash and wound.  Allergic/Immunologic: Negative for immunocompromised state.  Neurological: Negative for weakness and numbness.  All other systems reviewed and are negative.    Physical Exam Updated Vital Signs BP (!) 134/107   Pulse 77   Temp 98.5 F (36.9 C) (Oral)   Resp 18   Ht 5\' 3"  (1.6 m)   Wt 65 kg   SpO2 100%   BMI 25.38 kg/m   Physical  Exam Vitals signs and nursing note reviewed.  Constitutional:      General: She is not in acute distress.    Appearance: She is well-developed. She is not diaphoretic.  HENT:     Head: Normocephalic and atraumatic.  Cardiovascular:     Pulses: Normal pulses.  Pulmonary:     Effort: Pulmonary effort is normal.  Abdominal:     Palpations: Abdomen is soft.     Tenderness: There is no abdominal tenderness.  Musculoskeletal:        General: Tenderness present. No swelling, deformity or signs of injury.       Back:     Right lower leg: No edema.     Left lower leg: No edema.     Comments: Tenderness with palpation at the left SI joint, no pain with logroll of hips.  Straight leg raise positive for pain on left with left leg extension, hamstrings seem to be  very tight on the left compared to right.  Pain on left reproduced with flexion of left knee and hip with external rotation left hip.  Leg strength symmetric, reflexes symmetric.  Skin:    General: Skin is warm and dry.     Findings: No erythema or rash.  Neurological:     Mental Status: She is alert and oriented to person, place, and time.     Sensory: Sensation is intact. No sensory deficit.     Motor: No weakness.     Deep Tendon Reflexes: Babinski sign absent on the right side. Babinski sign absent on the left side.     Reflex Scores:      Patellar reflexes are 1+ on the right side and 1+ on the left side.      Achilles reflexes are 1+ on the right side and 1+ on the left side. Psychiatric:        Behavior: Behavior normal.      ED Treatments / Results  Labs (all labs ordered are listed, but only abnormal results are displayed) Labs Reviewed - No data to display  EKG None  Radiology Dg Lumbar Spine Complete  Result Date: 09/22/2018 CLINICAL DATA:  Left leg radiculopathy. Lumbosacral back pain radiating down posterior left leg for 3 weeks. No known injury. EXAM: LUMBAR SPINE - COMPLETE 4+ VIEW COMPARISON:  None. FINDINGS: The alignment is maintained. Vertebral body heights are normal. There is no listhesis. The posterior elements are intact. Mild disc space narrowing L3-L4 and L4-L5. Mild multilevel endplate spurring. Facet hypertrophy at L3-L4. No fracture. Sacroiliac joints are symmetric and normal. IMPRESSION: 1. No acute bony abnormality. 2. Mild spondylosis most prominent at L3-L4 with degenerative disc disease and facet hypertrophy. Electronically Signed   By: Keith Rake M.D.   On: 09/22/2018 21:10   Dg Hip Unilat With Pelvis 2-3 Views Left  Result Date: 09/22/2018 CLINICAL DATA:  Left leg radiculopathy. Lumbosacral back pain radiating down posterior left leg for 3 weeks. No known injury. EXAM: DG HIP (WITH OR WITHOUT PELVIS) 2-3V LEFT COMPARISON:  None. 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 Minimal heterotopic ossification about the proximal aspect of the tract adjacent to the greater trochanter. No acute fracture. Both femoral heads are well-seated respective acetabula. Pubic rami are intact. Pubic symphysis is congruent. IMPRESSION: 1. No acute osseous abnormality. 2. Remote fracture and postsurgical change of the left proximal femur. Electronically Signed   By: Keith Rake M.D.   On: 09/22/2018 21:12  Procedures Procedures (including critical care time)  Medications Ordered in ED Medications  ketorolac (TORADOL) injection 30 mg (30 mg Intramuscular Given 09/22/18 2024)     Initial Impression / Assessment and Plan / ED Course  I have reviewed the triage vital signs and the nursing notes.  Pertinent labs & imaging results that were available during my care of the patient were reviewed by me and considered in my medical decision making (see chart for details).  Clinical Course as of Sep 22 2127  Sat Sep 22, 2018  [id number] 58 year old female with complaint of left low back pain radiating down her left leg without trauma.  Exam consistent with sciatica.  X-ray of the left hip and lumbar spine are negative for acute injury.  Recommend patient apply warm compresses, gentle stretching, take Flexeril as prescribed with driving precautions given.  Also given prescription for Voltaren gel to apply.  Patient to recheck with her PCP, was advised she may need physical therapy or specialty referral.   [LM]    Clinical Course User Index [LM] Jeannie FendMurphy, Deray Dawes A, PA-C      Final Clinical Impressions(s) / ED Diagnoses   Final diagnoses:  Sciatica of left side    ED Discharge Orders         Ordered    cyclobenzaprine (FLEXERIL) 10 MG tablet  2 times daily PRN     09/22/18 2128    diclofenac sodium (VOLTAREN) 1 % GEL  4 times daily     09/22/18 2128           Jeannie FendMurphy, Narcisa Ganesh A, PA-C 09/22/18 2129     Alvira MondaySchlossman, Erin, MD 09/23/18 1526

## 2018-09-22 NOTE — ED Triage Notes (Signed)
[facility] with 2-3 weeks of low left back pain that goes into her hip and down to her foot. She reports that her foot is numb and the medications are not helping.She wa shere 4 days prior for the same.

## 2018-09-22 NOTE — ED Notes (Signed)
Patient transported to X-ray 

## 2019-03-05 ENCOUNTER — Encounter (HOSPITAL_BASED_OUTPATIENT_CLINIC_OR_DEPARTMENT_OTHER): Payer: Self-pay | Admitting: Emergency Medicine

## 2019-03-05 ENCOUNTER — Emergency Department (HOSPITAL_BASED_OUTPATIENT_CLINIC_OR_DEPARTMENT_OTHER): Payer: Self-pay

## 2019-03-05 ENCOUNTER — Other Ambulatory Visit: Payer: Self-pay

## 2019-03-05 ENCOUNTER — Emergency Department (HOSPITAL_BASED_OUTPATIENT_CLINIC_OR_DEPARTMENT_OTHER)
Admission: EM | Admit: 2019-03-05 | Discharge: 2019-03-05 | Disposition: A | Discharge status: Home / Self Care | Payer: Self-pay | Attending: Emergency Medicine | Admitting: Emergency Medicine

## 2019-03-05 DIAGNOSIS — R1013 Epigastric pain: Secondary | ICD-10-CM

## 2019-03-05 DIAGNOSIS — Z79899 Other long term (current) drug therapy: Secondary | ICD-10-CM | POA: Insufficient documentation

## 2019-03-05 DIAGNOSIS — Z7982 Long term (current) use of aspirin: Secondary | ICD-10-CM | POA: Insufficient documentation

## 2019-03-05 DIAGNOSIS — R2241 Localized swelling, mass and lump, right lower limb: Secondary | ICD-10-CM | POA: Insufficient documentation

## 2019-03-05 DIAGNOSIS — M25561 Pain in right knee: Secondary | ICD-10-CM | POA: Insufficient documentation

## 2019-03-05 DIAGNOSIS — I10 Essential (primary) hypertension: Secondary | ICD-10-CM | POA: Insufficient documentation

## 2019-03-05 LAB — URINALYSIS, ROUTINE W REFLEX MICROSCOPIC
Bilirubin Urine: NEGATIVE
Glucose, UA: NEGATIVE mg/dL
Hgb urine dipstick: NEGATIVE
Ketones, ur: NEGATIVE mg/dL
Leukocytes,Ua: NEGATIVE
Nitrite: NEGATIVE
Protein, ur: NEGATIVE mg/dL
Specific Gravity, Urine: 1.01 (ref 1.005–1.030)
pH: 6.5 (ref 5.0–8.0)

## 2019-03-05 LAB — COMPREHENSIVE METABOLIC PANEL
ALT: 11 U/L (ref 0–44)
AST: 17 U/L (ref 15–41)
Albumin: 4.6 g/dL (ref 3.5–5.0)
Alkaline Phosphatase: 81 U/L (ref 38–126)
Anion gap: 9 (ref 5–15)
BUN: 7 mg/dL (ref 6–20)
CO2: 28 mmol/L (ref 22–32)
Calcium: 9.4 mg/dL (ref 8.9–10.3)
Chloride: 105 mmol/L (ref 98–111)
Creatinine, Ser: 0.9 mg/dL (ref 0.44–1.00)
GFR calc Af Amer: >60 mL/min (ref >60)
GFR calc non Af Amer: >60 mL/min (ref >60)
Glucose, Bld: 85 mg/dL (ref 70–99)
Potassium: 3.8 mmol/L (ref 3.5–5.1)
Sodium: 142 mmol/L (ref 135–145)
Total Bilirubin: 0.4 mg/dL (ref 0.3–1.2)
Total Protein: 7.7 g/dL (ref 6.5–8.1)

## 2019-03-05 LAB — CBC WITH DIFFERENTIAL/PLATELET
Abs Immature Granulocytes: 0.01 10*3/uL (ref 0.00–0.07)
Basophils Absolute: 0 10*3/uL (ref 0.0–0.1)
Basophils Relative: 0 %
Eosinophils Absolute: 0 10*3/uL (ref 0.0–0.5)
Eosinophils Relative: 0 %
HCT: 42.2 % (ref 36.0–46.0)
Hemoglobin: 13.1 g/dL (ref 12.0–15.0)
Immature Granulocytes: 0 %
Lymphocytes Relative: 19 %
Lymphs Abs: 0.6 10*3/uL — ABNORMAL LOW (ref 0.7–4.0)
MCH: 25.8 pg — ABNORMAL LOW (ref 26.0–34.0)
MCHC: 31 g/dL (ref 30.0–36.0)
MCV: 83.2 fL (ref 80.0–100.0)
Monocytes Absolute: 0.2 10*3/uL (ref 0.1–1.0)
Monocytes Relative: 6 %
Neutro Abs: 2.2 10*3/uL (ref 1.7–7.7)
Neutrophils Relative %: 75 %
Platelets: 176 10*3/uL (ref 150–400)
RBC: 5.07 MIL/uL (ref 3.87–5.11)
RDW: 13.4 % (ref 11.5–15.5)
WBC: 3 10*3/uL — ABNORMAL LOW (ref 4.0–10.5)
nRBC: 0 % (ref 0.0–0.2)

## 2019-03-05 LAB — LIPASE, BLOOD: Lipase: 39 U/L (ref 11–51)

## 2019-03-05 NOTE — ED Triage Notes (Addendum)
Pt reports abd pain x 3 days and right knee pain and swelling x 4 days. Hx arthritis. Denies nausea, vomiting. sts has regular BM.

## 2019-03-05 NOTE — Discharge Instructions (Signed)
Your work-up today was overall reassuring.  Your ultrasound not show evidence of gallbladder disease or cholecystitis.  Your labs were reassuring.  Your x-ray of your knee showed a slight effusion and some degenerative changes which we discussed similar to prior.  Clinically we have a very low suspicion for infection and we agreed together not to do aspiration.  Your ultrasound not show a blood clot in the leg.  Given the change in medication, I suspect mild to gastritis or irritation of your stomach.  Please follow-up with your primary doctor and stay hydrated.  Please rest.  If any symptoms change or worsen, please return to nearest emergency department.

## 2019-03-05 NOTE — ED Provider Notes (Signed)
[place] EMERGENCY DEPARTMENT Provider Note   CSN: [id number] Arrival date & time: 03/05/19  4431     History Chief Complaint  Patient presents with  . Abdominal Pain  . Knee Pain    right    Rebecca Thompson is a 58 y.o. female.  The history is provided by the patient and medical records. No language interpreter was used.  Abdominal Pain Pain location:  Epigastric Pain quality: aching and cramping   Pain severity:  Moderate Onset quality:  Gradual Duration:  4 days Timing:  Constant Progression:  Waxing and waning Chronicity:  New Relieved by:  Nothing Worsened by:  Eating Associated symptoms: nausea   Associated symptoms: no chest pain, no chills, no constipation, no cough, no diarrhea, no dysuria, no fatigue, no fever, no flatus, no shortness of breath, no vaginal bleeding, no vaginal discharge and no vomiting        Past Medical History:  Diagnosis Date  . Hypertension   . Hypokalemia     There are no problems to display for this patient.   Past Surgical History:  Procedure Laterality Date  . CESAREAN SECTION    . EYE SURGERY    . FOOT SURGERY    . HERNIA REPAIR    . LEG SURGERY    . TUBAL LIGATION       OB History   No obstetric history on file.     No family history on file.  Social History   Tobacco Use  . Smoking status: Never Smoker  . Smokeless tobacco: Never Used  Substance Use Topics  . Alcohol use: No  . Drug use: No    Home Medications Prior to Admission medications   Medication Sig Start Date End Date Taking? Authorizing Provider  amLODipine (NORVASC) 10 MG tablet Take 10 mg by mouth daily.      [provider]  aspirin 325 MG tablet Take 325 mg by mouth daily.      [provider]  cyclobenzaprine (FLEXERIL) 10 MG tablet Take 1 tablet (10 mg total) by mouth 2 (two) times daily as needed for muscle spasms. 09/22/18   Tacy Learn, PA-C  diclofenac sodium (VOLTAREN) 1 % GEL Apply 2 g topically 4  (four) times daily. 09/22/18   Tacy Learn, PA-C  potassium chloride SA (K-DUR,KLOR-CON) 20 MEQ tablet Take 20 mEq by mouth 2 (two) times daily.      [provider]    Allergies    Patient has no known allergies.  Review of Systems   Review of Systems  Constitutional: Negative for chills, diaphoresis, fatigue and fever.  HENT: Negative for congestion.   Eyes: Negative for visual disturbance.  Respiratory: Negative for cough, choking, chest tightness, shortness of breath and wheezing.   Cardiovascular: Negative for chest pain, palpitations and leg swelling.  Gastrointestinal: Positive for abdominal pain and nausea. Negative for abdominal distention, constipation, diarrhea, flatus and vomiting.  Genitourinary: Negative for dysuria, flank pain, frequency, vaginal bleeding and vaginal discharge.  Musculoskeletal: Negative for back pain, neck pain and neck stiffness.  Skin: Negative for rash and wound.  Neurological: Negative for weakness, light-headedness, numbness and headaches.  Psychiatric/Behavioral: Negative for agitation and confusion.  All other systems reviewed and are negative.   Physical Exam Updated Vital Signs BP (!) 167/75 (BP Location: Right Arm)   Pulse 87   Temp 98.8 F (37.1 C) (Oral)   Resp 18   Ht 5\' 2"  (1.575 m)   Wt 66.7  kg   SpO2 100%   BMI 26.89 kg/m   Physical Exam Vitals and nursing note reviewed.  Constitutional:      General: She is not in acute distress.    Appearance: She is well-developed. She is not ill-appearing, toxic-appearing or diaphoretic.  HENT:     Head: Normocephalic and atraumatic.     Right Ear: External ear normal.     Left Ear: External ear normal.     Nose: Nose normal.     Mouth/Throat:     Pharynx: No oropharyngeal exudate.  Eyes:     Conjunctiva/sclera: Conjunctivae normal.     Pupils: Pupils are equal, round, and reactive to light.  Cardiovascular:     Rate and Rhythm: Normal rate.     Heart sounds: Normal  heart sounds. No murmur.  Pulmonary:     Effort: No respiratory distress.     Breath sounds: No stridor.  Abdominal:     General: Abdomen is flat. Bowel sounds are normal. There is no distension.     Tenderness: There is abdominal tenderness in the right upper quadrant and epigastric area. There is no right CVA tenderness, left CVA tenderness or rebound.  Musculoskeletal:     Cervical back: Normal range of motion and neck supple.  Skin:    General: Skin is warm.     Findings: No erythema or rash.  Neurological:     General: No focal deficit present.     Mental Status: She is alert.     Motor: No abnormal muscle tone.     Coordination: Coordination normal.     Deep Tendon Reflexes: Reflexes are normal and symmetric.  Psychiatric:        Mood and Affect: Mood normal.     ED Results / Procedures / Treatments   Labs (all labs ordered are listed, but only abnormal results are displayed) Labs Reviewed  CBC WITH DIFFERENTIAL/PLATELET - Abnormal; Notable for the following components:      Result Value   WBC 3.0 (*)    MCH 25.8 (*)    Lymphs Abs 0.6 (*)    All other components within normal limits  URINALYSIS, ROUTINE W REFLEX MICROSCOPIC - Abnormal; Notable for the following components:   Color, Urine STRAW (*)    All other components within normal limits  URINE CULTURE  COMPREHENSIVE METABOLIC PANEL  LIPASE, BLOOD    EKG None  Radiology US Venous Img Lower Unilateral Right  Result Date: 03/05/2019 CLINICAL DATA:  Right knee pain swelling x3 days EXAM: RIGHT LOWER EXTREMITY VENOUS DOPPLER ULTRASOUND TECHNIQUE: Gray-scale sonography with graded compression, as well as color Doppler and duplex ultrasound were performed to evaluate the lower extremity deep venous systems from the level of the common femoral vein and including the common femoral, femoral, profunda femoral, popliteal and calf veins including the posterior tibial, peroneal and gastrocnemius veins when visible. The  superficial great saphenous vein was also interrogated. Spectral Doppler was utilized to evaluate flow at rest and with distal augmentation maneuvers in the common femoral, femoral and popliteal veins. COMPARISON:  None. FINDINGS: Contralateral Common Femoral Vein: Respiratory phasicity is normal and symmetric with the symptomatic side. No evidence of thrombus. Normal compressibility. Common Femoral Vein: No evidence of thrombus. Normal compressibility, respiratory phasicity and response to augmentation. Saphenofemoral Junction: No evidence of thrombus. Normal compressibility and flow on color Doppler imaging. Profunda Femoral Vein: No evidence of thrombus. Normal compressibility and flow on color Doppler imaging. Femoral Vein: No evidence of thrombus.  Normal compressibility, respiratory phasicity and response to augmentation. Popliteal Vein: No evidence of thrombus. Normal compressibility, respiratory phasicity and response to augmentation. Calf Veins: No evidence of thrombus. Normal compressibility and flow on color Doppler imaging. Superficial Great Saphenous Vein: No evidence of thrombus. Normal compressibility. Venous Reflux:  None. Other Findings:  None. IMPRESSION: No deep venous thrombosis in the visualized right lower extremity. Electronically Signed   By: Charline Bills M.D.   On: 03/05/2019 10:40   DG Knee Complete 4 Views Right  Result Date: 03/05/2019 CLINICAL DATA:  Right knee pain/swelling EXAM: RIGHT KNEE - COMPLETE 4+ VIEW COMPARISON:  None. FINDINGS: No fracture or dislocation is seen. Joint spaces are preserved.  Mild sharpening of the tibial spines. Suspected small suprapatellar knee joint effusion. IMPRESSION: No fracture or dislocation is seen. Suspected small suprapatellar knee joint effusion. Electronically Signed   By: Charline Bills M.D.   On: 03/05/2019 10:39   US Abdomen Limited RUQ  Result Date: 03/05/2019 CLINICAL DATA:  Abdominal pain EXAM: ULTRASOUND ABDOMEN LIMITED  RIGHT UPPER QUADRANT COMPARISON:  None. FINDINGS: Gallbladder: No gallstones or wall thickening visualized. No sonographic Murphy sign noted by sonographer. Common bile duct: Diameter: Normal caliber, 2 mm Liver: No focal lesion identified. Within normal limits in parenchymal echogenicity. Portal vein is patent on color Doppler imaging with normal direction of blood flow towards the liver. Other: None. IMPRESSION: Normal right upper quadrant ultrasound. Electronically Signed   By: Charlett Nose M.D.   On: 03/05/2019 10:36    Procedures Procedures (including critical care time)  Medications Ordered in ED Medications - No data to display  ED Course  I have reviewed the triage vital signs and the nursing notes.  Pertinent labs & imaging results that were available during my care of the patient were reviewed by me and considered in my medical decision making (see chart for details).    MDM Rules/Calculators/A&P                      Casady Voshell is a 58 y.o. female with a past medical history significant for hypertension and arthritis who presents with several days of abdominal pain with nausea and some right knee discomfort.  She reports that for the last 4 to 5 days she has had upper abdominal discomfort with some nausea but no vomiting.  She reports she is never had this pain in the past.  She denies history of trauma or medication use.  She reports that it is somewhat worse after she eats and drinks and she has had the nausea.  She denies any change in bowel movements or constipation.  She denies any urinary symptoms.  She reports that on Christmas she had right knee pain and swelling but that has been improving.  She does report that she now has some pain behind her right knee which she has not had this exact same in the past.  Does report history of arthritis however.  She reports that she has taken anti-inflammatory medication and it has improved the discomfort in her knee.  No history of DVT or  PE.  She denies other complaints.  She denies any chest pain, shortness of breath, fevers, or chills.  She denies any palpitations or syncope.  On exam, abdomen is tender in her epigastrium and right upper quadrant.  Bowel sounds were appreciated.  No CVA or back tenderness.  Lungs clear and chest is nontender.  Patient has good pulses in lower extremities bilaterally.  Normal sensation and strength in legs.  Patient had some tenderness in her popliteal fossa on the right and her upper right calf.  Knee was slightly tender and there was possibly minimal edema.  No significant erythema or warmth.  Normal knee range of motion.  No hip tenderness.  Had a shared decision made conversation with patient we agreed to get labs and right upper quadrant ultrasound to look for intra-abdominal pathology.  Low suspicion for obstruction, appendicitis, or diverticulitis given the location of discomfort and wants to get the ultrasound and labs.  Will also get ultrasound and x-ray of the right leg.  Given her lack of fevers, chills, or infectious symptoms in the knee, will hold on knee aspiration at this time.  Anticipate reassessment discharge if work-up is complete reassuring.  Patient agreed with not performing aspiration at this time.  Work-up was reassuring and there was no evidence of DVT in her leg, only a small effusion on the x-ray of the knee, and ultrasound of the abdomen did not show cholecystitis.  Labs showed mild leukopenia but otherwise was reassuring.  Patient was feeling well and would like to go home.    Patient reports that he is a change of her regiment of oral medication of vitamin D and thinks this may irritate her stomach.  Given the epigastric discomfort and her otherwise reassuring work-up, this may be the case.  Patient was advised to stay hydrated and follow-up with her PCP for further guidance on this.  She understands return precautions and was discharged in good condition.    Final  Clinical Impression(s) / ED Diagnoses Final diagnoses:  Epigastric pain  Acute pain of right knee    Rx / DC Orders ED Discharge Orders    None      Clinical Impression: 1. Acute pain of right knee   2. Epigastric pain     Disposition: Discharge  Condition: Good  I have discussed the results, Dx and Tx plan with the pt(& family if present). He/she/they expressed understanding and agree(s) with the plan. Discharge instructions discussed at great length. Strict return precautions discussed and pt &/or family have verbalized understanding of the instructions. No further questions at time of discharge.    New Prescriptions   No medications on file    Follow Up: Cousins, [address] [place] [address] [phone]     [facility] [place] [facility] [address] [id number] [address] [place] [address] [phone]       Amrit Erck, Canary Brim, MD 03/05/19 1540

## 2019-03-05 NOTE — ED Notes (Signed)
Patient transported to Ultrasound 

## 2019-03-05 NOTE — Progress Notes (Signed)
TOC Chart Reviewed: 3 ED visits in 6 months, pt has no insurance and noted PCP. EDP please put in [facility] referral if needs identified. [place], [place] ED TOC CM [phone]

## 2019-03-06 LAB — URINE CULTURE: Culture: NO GROWTH

## 2020-07-11 ENCOUNTER — Other Ambulatory Visit: Payer: Self-pay

## 2020-07-11 ENCOUNTER — Emergency Department (HOSPITAL_BASED_OUTPATIENT_CLINIC_OR_DEPARTMENT_OTHER): Payer: Self-pay

## 2020-07-11 ENCOUNTER — Emergency Department (HOSPITAL_BASED_OUTPATIENT_CLINIC_OR_DEPARTMENT_OTHER)
Admission: EM | Admit: 2020-07-11 | Discharge: 2020-07-11 | Disposition: A | Discharge status: Home / Self Care | Payer: Self-pay | Attending: Emergency Medicine | Admitting: Emergency Medicine

## 2020-07-11 ENCOUNTER — Encounter (HOSPITAL_BASED_OUTPATIENT_CLINIC_OR_DEPARTMENT_OTHER): Payer: Self-pay | Admitting: Emergency Medicine

## 2020-07-11 DIAGNOSIS — M79642 Pain in left hand: Secondary | ICD-10-CM | POA: Insufficient documentation

## 2020-07-11 DIAGNOSIS — Z7982 Long term (current) use of aspirin: Secondary | ICD-10-CM | POA: Insufficient documentation

## 2020-07-11 DIAGNOSIS — Z79899 Other long term (current) drug therapy: Secondary | ICD-10-CM | POA: Insufficient documentation

## 2020-07-11 DIAGNOSIS — I10 Essential (primary) hypertension: Secondary | ICD-10-CM | POA: Insufficient documentation

## 2020-07-11 MED ORDER — IBUPROFEN 400 MG PO TABS: 400.0000 mg | ORAL_TABLET | Freq: Four times a day (QID) | ORAL | 0 refills | Status: AC | PRN

## 2020-07-11 MED ORDER — IBUPROFEN 400 MG PO TABS
400.0000 mg | ORAL_TABLET | Freq: Once | ORAL | Status: AC
  Administered 2020-07-11: 400 mg via ORAL
  Filled 2020-07-11: qty 1

## 2020-07-11 NOTE — ED Triage Notes (Signed)
Pt reports jammed left ring finger couple days ago. Pt does not remember incident. Pt reports unable to straighten fingers at this time.

## 2020-07-11 NOTE — ED Notes (Signed)
Patient was able to remove pinky finger ring on her own.

## 2020-07-11 NOTE — ED Provider Notes (Signed)
[facility] EMERGENCY DEPARTMENT Provider Note   CSN: [id number] Arrival date & time: 07/11/20  0707     History Chief Complaint  Patient presents with  . Hand Pain    Rebecca Thompson is a 60 y.o. female.  HPI   Patient presents to the ED for evaluation of swelling and tenderness in her left hand.  Patient states she jammed her finger a couple days ago.  She does not remember exactly how she did it.  Patient states it hurts now to straighten her fingers and her hand is very tender to touch.  She has noticed swelling.  No fevers or chills denies any elbow or wrist pain.  No other complaints  Past Medical History:  Diagnosis Date  . Hypertension   . Hypokalemia     There are no problems to display for this patient.   Past Surgical History:  Procedure Laterality Date  . CESAREAN SECTION    . EYE SURGERY    . FOOT SURGERY    . HERNIA REPAIR    . LEG SURGERY    . TUBAL LIGATION       OB History   No obstetric history on file.     No family history on file.  Social History   Tobacco Use  . Smoking status: Never Smoker  . Smokeless tobacco: Never Used  Vaping Use  . Vaping Use: Never used  Substance Use Topics  . Alcohol use: No  . Drug use: No    Home Medications Prior to Admission medications   Medication Sig Start Date End Date Taking? Authorizing Provider  ibuprofen (ADVIL) 400 MG tablet Take 1 tablet (400 mg total) by mouth every 6 (six) hours as needed. 07/11/20  Yes Linwood Dibbles, MD  amLODipine (NORVASC) 10 MG tablet Take 10 mg by mouth daily.      [provider]  aspirin 325 MG tablet Take 325 mg by mouth daily.      [provider]  potassium chloride SA (K-DUR,KLOR-CON) 20 MEQ tablet Take 20 mEq by mouth 2 (two) times daily.      [provider]    Allergies    Patient has no known allergies.  Review of Systems   Review of Systems  All other systems reviewed and are negative.   Physical Exam Updated Vital  Signs BP (!) 154/87 (BP Location: Right Arm)   Pulse 77   Temp 98.1 F (36.7 C)   Resp 16   Ht 1.575 m (5\' 2" )   Wt 68 kg   SpO2 99%   BMI 27.44 kg/m   Physical Exam Vitals and nursing note reviewed.  Constitutional:      General: She is not in acute distress.    Appearance: She is well-developed.  HENT:     Head: Normocephalic and atraumatic.     Right Ear: External ear normal.     Left Ear: External ear normal.  Eyes:     General: No scleral icterus.       Right eye: No discharge.        Left eye: No discharge.     Conjunctiva/sclera: Conjunctivae normal.  Neck:     Trachea: No tracheal deviation.  Cardiovascular:     Rate and Rhythm: Normal rate.  Pulmonary:     Effort: Pulmonary effort is normal. No respiratory distress.     Breath sounds: No stridor.  Abdominal:     General: There is no distension.  Musculoskeletal:  General: Swelling and tenderness present. No deformity.     Cervical back: Neck supple.     Comments: Tenderness palpation over the  metacarpal bones of the left hand, primarily in the ring middle and index finger region, no bruising, no increased warmth, no wrist or elbow tenderness, sensation intact  Skin:    General: Skin is warm and dry.     Findings: No rash.  Neurological:     Mental Status: She is alert.     Cranial Nerves: Cranial nerve deficit: no gross deficits.     ED Results / Procedures / Treatments   Labs (all labs ordered are listed, but only abnormal results are displayed) Labs Reviewed - No data to display  EKG None  Radiology DG Hand Complete Left  Result Date: 07/11/2020 CLINICAL DATA:  Jammed left ring finger a couple of days ago. Pain and swelling. EXAM: LEFT HAND - COMPLETE 3+ VIEW COMPARISON:  None. FINDINGS: No fracture or bone lesion. Joints normally spaced and aligned. Soft tissues are unremarkable. IMPRESSION: Negative. Electronically Signed   By: Amie Portland M.D.   On: 07/11/2020 08:05     Procedures Procedures   Medications Ordered in ED Medications  ibuprofen (ADVIL) tablet 400 mg (400 mg Oral Given 07/11/20 4132)    ED Course  I have reviewed the triage vital signs and the nursing notes.  Pertinent labs & imaging results that were available during my care of the patient were reviewed by me and considered in my medical decision making (see chart for details).  Clinical Course as of 07/11/20 4401  Sat Jul 11, 2020  0272 Recommended patient remove the ring on her pinky in case the swelling worsens.  Patient was able to do so on her own [JK]    Clinical Course User Index [JK] Linwood Dibbles, MD   MDM Rules/Calculators/A&P                          Patient presented to the ED complaints of left hand pain and swelling.  She thinks she jammed her hand on something a couple days ago.  Patient's x-rays do not show any signs of fracture.  There is no erythema or other signs to suggest infection.  She does not have any evidence of vascular compromise.  We will have her try taking NSAIDs and applying ice.  Follow-up with sports medicine if symptoms not improving in the next week Final Clinical Impression(s) / ED Diagnoses Final diagnoses:  Left hand pain    Rx / DC Orders ED Discharge Orders         Ordered    ibuprofen (ADVIL) 400 MG tablet  Every 6 hours PRN        07/11/20 0820           Linwood Dibbles, MD 07/11/20 [phone]

## 2020-07-11 NOTE — Discharge Instructions (Signed)
Take the medications as needed for pain.  Try applying ice to help with the swelling.  Return to the ED for fevers, redness or other signs of infection.

## 2021-08-18 IMAGING — CR DG HAND COMPLETE 3+V*L*
3 series · 3 of 3 positions shown · non-contrast
Comparison: None.

CLINICAL DATA: Jammed left ring finger a couple of days ago. Pain
and swelling.

EXAM:
LEFT HAND - COMPLETE 3+ VIEW

[x hand pa left]
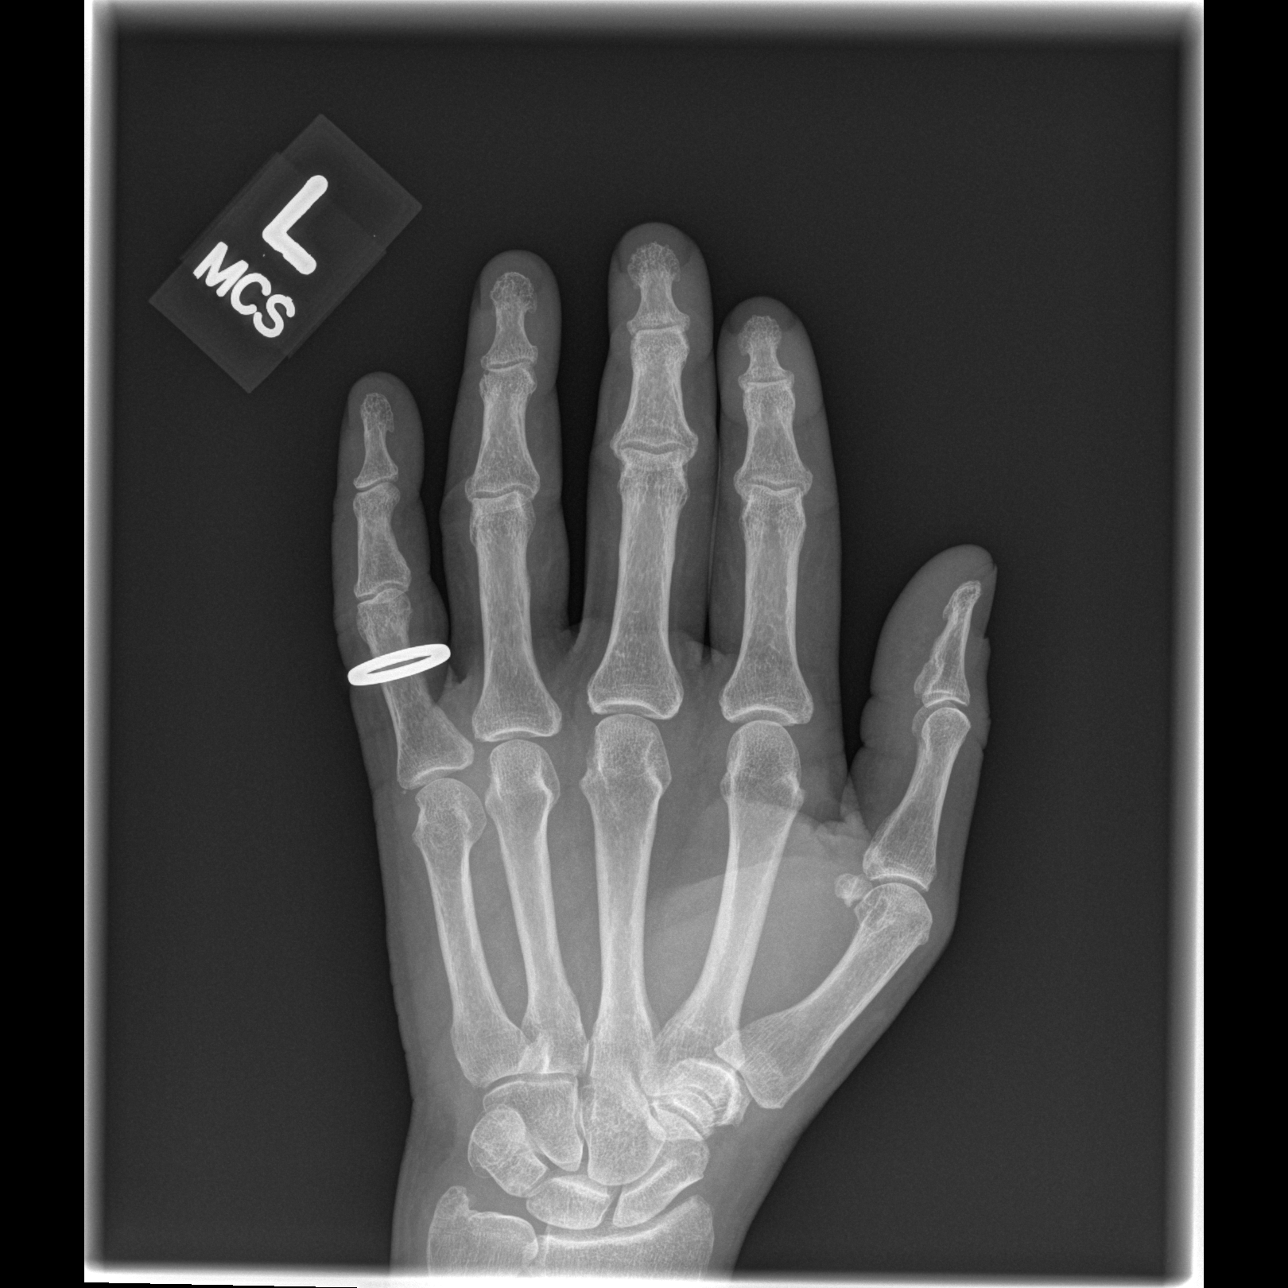

[x hand oblique left]
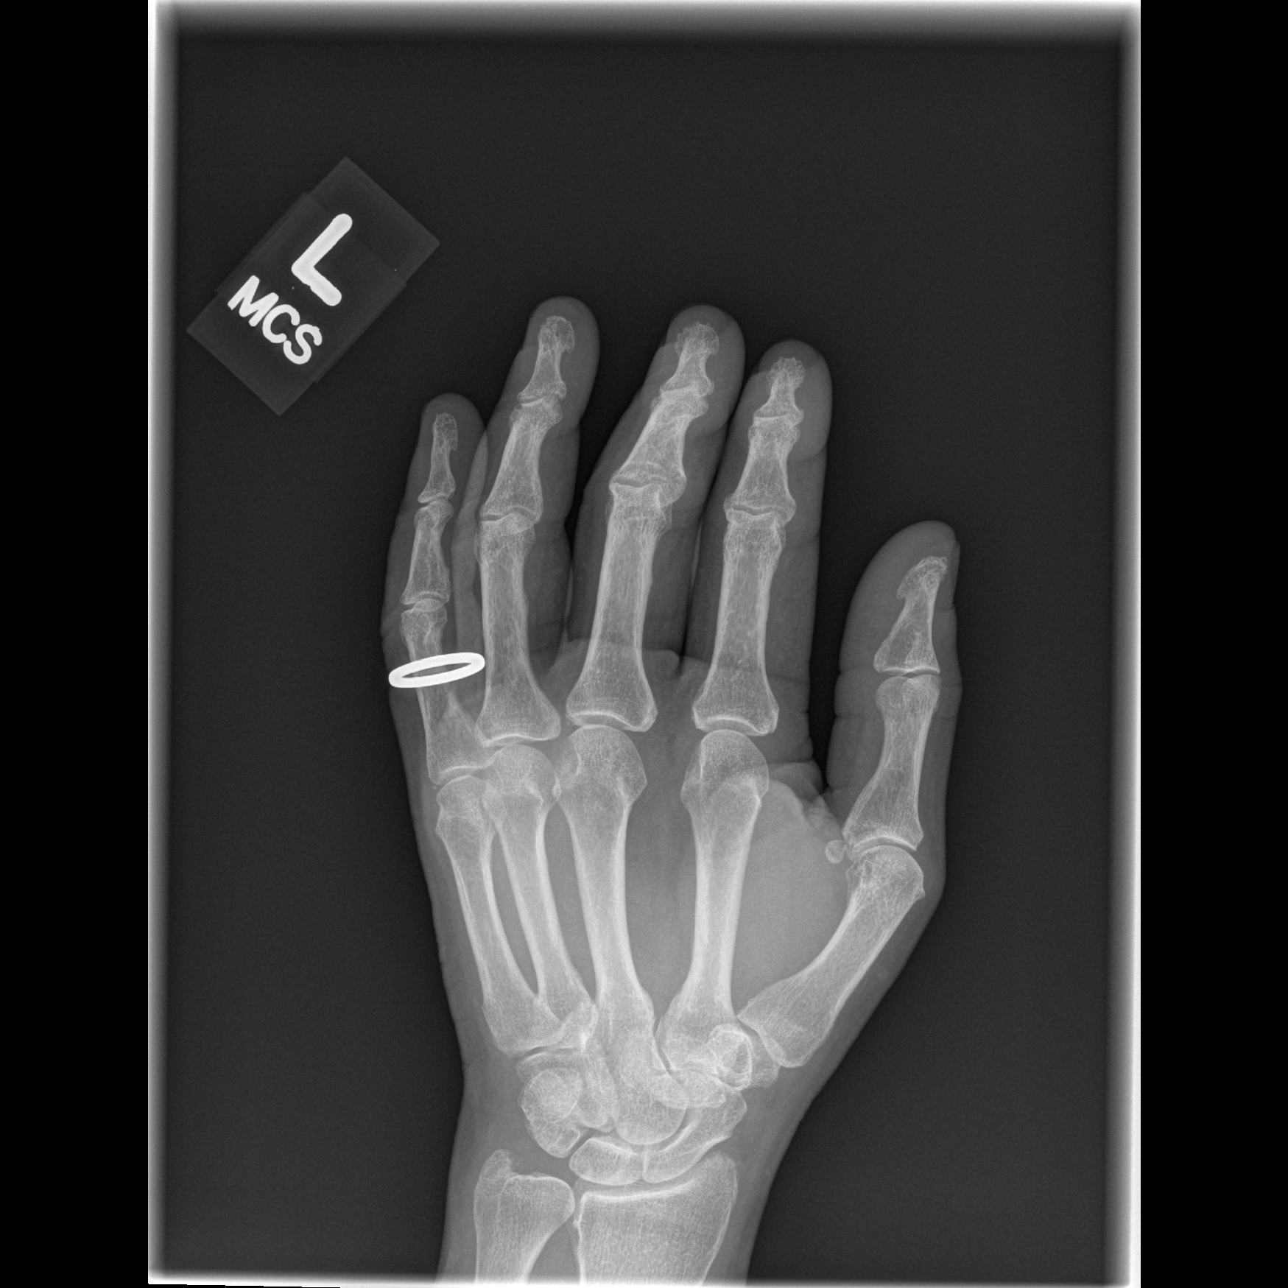

[x hand lat left]
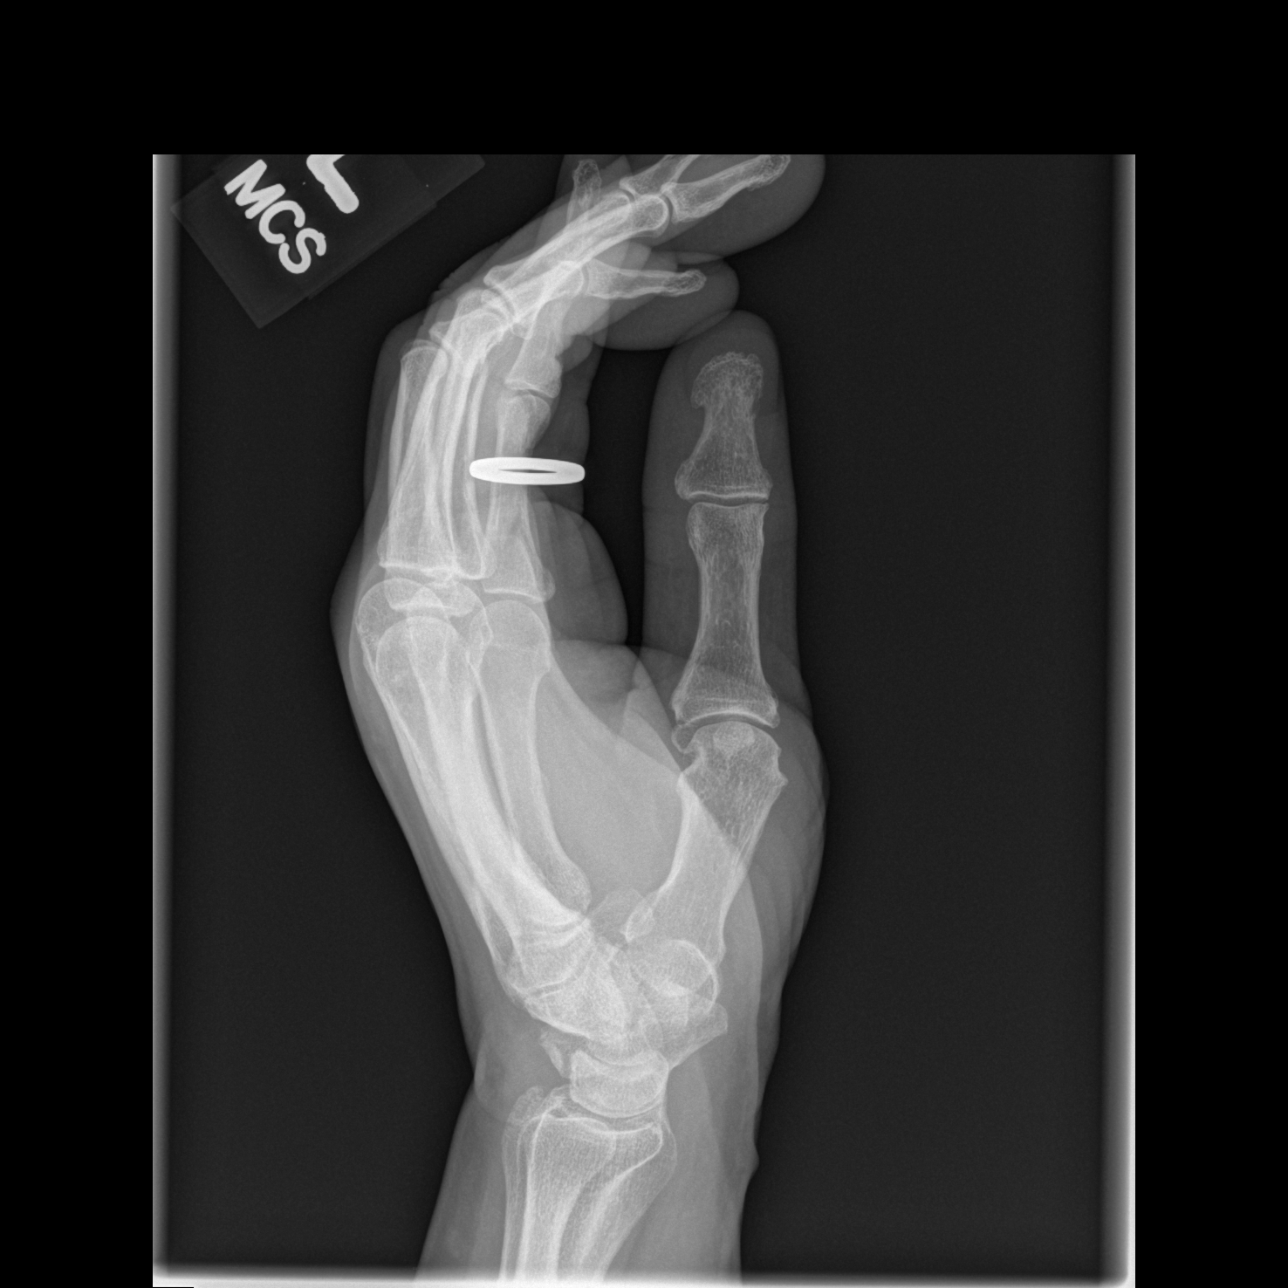

[3 of 3 positions shown; findings below may reference images not displayed]

FINDINGS: No fracture or bone lesion.

Joints normally spaced and aligned.

Soft tissues are unremarkable.
IMPRESSION: Negative.
